# Patient Record
Sex: Male | Born: 2000 | Race: White | Hispanic: No | Marital: Single | Smoking: Never smoker
Health system: Southern US, Community
[De-identification: ages and names within clinical notes are randomized; demographics above are authoritative.]

## PROBLEM LIST (undated history)

## (undated) DIAGNOSIS — R51 Headache: Secondary | ICD-10-CM

## (undated) HISTORY — DX: Headache: R51

---
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
## 2021-06-25 ENCOUNTER — Ambulatory Visit: Payer: Medicaid Other | Admitting: Allergy and Immunology
